# Patient Record
Sex: Male | Born: 1985 | Race: White | Hispanic: No | Marital: Single | Smoking: Current some day smoker
Health system: Southern US, Community
[De-identification: ages and names within clinical notes are randomized; demographics above are authoritative.]

## PROBLEM LIST (undated history)

## (undated) DIAGNOSIS — F909 Attention-deficit hyperactivity disorder, unspecified type: Secondary | ICD-10-CM

## (undated) DIAGNOSIS — A64 Unspecified sexually transmitted disease: Secondary | ICD-10-CM

---
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
## 2013-12-28 ENCOUNTER — Emergency Department (HOSPITAL_COMMUNITY)
Admission: EM | Admit: 2013-12-28 | Discharge: 2013-12-28 | Disposition: A | Discharge status: Home / Self Care | Payer: Self-pay | Attending: Emergency Medicine | Admitting: Emergency Medicine

## 2013-12-28 ENCOUNTER — Encounter (HOSPITAL_COMMUNITY): Payer: Self-pay | Admitting: Emergency Medicine

## 2013-12-28 DIAGNOSIS — R369 Urethral discharge, unspecified: Secondary | ICD-10-CM | POA: Insufficient documentation

## 2013-12-28 DIAGNOSIS — F17200 Nicotine dependence, unspecified, uncomplicated: Secondary | ICD-10-CM | POA: Insufficient documentation

## 2013-12-28 DIAGNOSIS — R300 Dysuria: Secondary | ICD-10-CM | POA: Insufficient documentation

## 2013-12-28 LAB — RPR: RPR Ser Ql: NONREACTIVE

## 2013-12-28 LAB — HIV ANTIBODY (ROUTINE TESTING W REFLEX): HIV: NONREACTIVE

## 2013-12-28 MED ORDER — CEFTRIAXONE SODIUM 250 MG IJ SOLR
250.0000 mg | Freq: Once | INTRAMUSCULAR | Status: AC
  Administered 2013-12-28: 250 mg via INTRAMUSCULAR
  Filled 2013-12-28: qty 250

## 2013-12-28 MED ORDER — AZITHROMYCIN 250 MG PO TABS
1000.0000 mg | ORAL_TABLET | Freq: Once | ORAL | Status: AC
  Administered 2013-12-28: 1000 mg via ORAL
  Filled 2013-12-28: qty 4

## 2013-12-28 MED ORDER — LIDOCAINE HCL 1 % IJ SOLN
INTRAMUSCULAR | Status: AC
  Administered 2013-12-28: 2.1 mL
  Filled 2013-12-28: qty 20

## 2013-12-28 NOTE — Progress Notes (Signed)
[facility] provided pt with a list of primary care resources to help patient establish primary care.  °

## 2013-12-28 NOTE — ED Notes (Signed)
Pt states that he has clear, watery discharge from penis with intermittent pain with urination. Pt afraid he has contracted chlamydia from his ex.

## 2013-12-28 NOTE — Discharge Instructions (Signed)
Refrain from sexual intercourse for 7 days. Be sure to have all partners tested and treated for STDs.  This may be done by your primary care provider or at the health department.  Practice safe sex by always wearing condoms.   Emergency Department Resource Guide 1) Find a Doctor and Pay Out of Pocket Although you won't have to find out who is covered by your insurance plan, it is a good idea to ask around and get recommendations. You will then need to call the office and see if the doctor you have chosen will accept you as a new patient and what types of options they offer for patients who are self-pay. Some doctors offer discounts or will set up payment plans for their patients who do not have insurance, but you will need to ask so you aren't surprised when you get to your appointment.  2) Contact Your Local Health Department Not all health departments have doctors that can see patients for sick visits, but many do, so it is worth a call to see if yours does. If you don't know where your local health department is, you can check in your phone book. The CDC also has a tool to help you locate your state's health department, and many state websites also have listings of all of their local health departments.  3) Find a Walk-in Clinic If your illness is not likely to be very severe or complicated, you may want to try a walk in clinic. These are popping up all over the country in pharmacies, drugstores, and shopping centers. They're usually staffed by nurse practitioners or physician assistants that have been trained to treat common illnesses and complaints. They're usually fairly quick and inexpensive. However, if you have serious medical issues or chronic medical problems, these are probably not your best option.  No Primary Care Doctor: - Call Health Connect at  [phone] - they can help you locate a primary care doctor that  accepts your insurance, provides certain services, etc. - Physician Referral  Service- [phone]  Chronic Pain Problems: Organization         Address  Phone   Notes  [facility]  [phone] Patients need to be referred by their primary care doctor.   Medication Assistance: Organization         Address  Phone   Notes  [facility] Med[facility] [address] [place], [place] [phone] --Must be a resident of [facility] [place] -- Must have NO insurance coverage whatsoever (no Medicaid/ Medicare, etc.) -- The pt. MUST have a primary care doctor that directs their care regularly and follows them in the community   Med[facility]ist  [phone]   [employer]  [phone]    Agencies that provide inexpensive medical care: Buyer, retailrganization         Address                                                       Phone  Notes  [facility]  [phone]   [facility]    [phone]   [facility] [address] [place], [place] [address] [phone]   [facility] [address], [place] [phone]   Planned Parenthood    [phone]   [facility]    [phone]   [facility]  [address], [place] Phone:  [phone], Fax:  [phone] Hours of Operation:  9 am - 6 pm, M-F.  Also accepts Medicaid/Medicare and self-pay.  [facility]  [address], [place] Phone: [phone], Fax: [phone]. Hours of Operation:  8:30 am - 5:30 pm, M-F.  Also accepts Medicaid and self-pay.  [facility] [place] [address] [place] Phone: [phone]   [facility] [address] Natasha Bence [place], [place] [phone], Ext. [phone] Mondays & Thursdays: 7-9 AM.  First 15 patients are seen on a first come, first serve basis.    Medicaid-accepting  [facility] Providers:  Organization         Address                                                                       Phone                               Notes  [facility] [address], [place] [phone] Also accepts self-pay patients.  [facility] [address] Laurell Josephs [place], [place]  [phone]   [facility] [address], [place] [phone]   [facility] [address] [place] [phone]   Renaye Rakers [address], [place]   [phone] Only accepts [place] patients after they have their name applied to their card.   Self-Pay (no insurance) in [facility]:   Organization         Address                                                     Phone               Notes  Sickle Cell Patients, [facility] [address] [place], [place] [phone]   [facility] [address] [place], [place] [phone]   [facility] [place]  [address], [place] [phone]   [facility]/Dr. Osei-Bonsu  [address] [place] or [address], [place] [phone] Phone number for both [place] and [place] locations is the same.  [facility] [address], [place] [phone]   [facility] [address], [place] or [address] [phone]   [facility] [address], [place] [phone], phone; [phone], fax Sees patients 1st and 3rd Saturday of every month.  Must not qualify for public or private insurance (i.e. Medicaid, Medicare, [place] Health Choice, Veterans' Benefits)  Household income should be no more than 200% of the poverty level The clinic cannot treat you if you are pregnant or think you are pregnant   Sexually transmitted diseases are not treated at the clinic.    Dental Care: Organization         Address                                  Phone                       Notes  [facility] Department of [facility] [address] [place], [place] [phone] Accepts children up to age 69 who are enrolled in [place] or [place] Health Choice; pregnant women with a Medicaid card; and children who have applied for Medicaid or [place] Health Choice, but were declined, whose parents can pay a reduced fee at time of service.  [facility] Department of [facility]  [address], [place] [phone] Accepts children up to age 28 who are enrolled in [place] or [place] Health Choice; pregnant women with a Medicaid card; and children who have applied for Medicaid or [place] Health Choice, but were declined, whose parents can pay a reduced fee at time of service.  [facility]  [address] [place], [place] [phone] Patients are seen by appointment only. Walk-ins are not accepted. [facility] will see patients 69 years of age and older. Monday - Tuesday (8am-5pm) Most Wednesdays (8:30-5pm) $30 per visit, cash only  [facility] Adult Dental Access PROGRAM  [address], [facility] [phone] Patients are seen by appointment only. Walk-ins are not accepted. [facility] will see patients 64 years of age and older. One Wednesday Evening (Monthly: Volunteer Based).  $30 per visit, cash only  [employer]  [phone] for adults; Children under age 74, call Graduate Pediatric Dentistry at [phone]. Children aged 21-14, please call [phone] to request a pediatric application.  Dental services are provided in all areas of dental care including fillings, crowns and bridges, complete and partial dentures, implants, gum treatment, root canals, and extractions. Preventive care is also provided. Treatment  is provided to both adults and children. Patients are selected via a lottery and there is often a waiting list.   [facility] [address] [place]  [phone] [email or web address]   [facility] [address] [place], [place] [phone], Ext. [phone] Second and Fourth Thursday of each month, opens at 6:30 AM; Clinic ends at 9 AM.  Patients are seen on a first-come first-served basis, and a limited number are seen during each clinic.   [facility]  [address] Ether Griffins [place], [place] [phone]   Eligibility Requirements You must have lived in [place], [place], or [place] counties for at least the last three months.   You cannot be eligible for state or federal sponsored [place], including [employer], [place], or [employer].   You generally cannot be eligible for healthcare insurance through your employer.    How to apply: Eligibility screenings are held every  Tuesday and Wednesday afternoon from 1:00 pm until 4:00 pm. You do not need an appointment for the interview!  [facility] [address] [place], [place] [phone]   [facility] Health Department  [phone]   [facility] [place] Health Department  [phone]   [facility] Health Department  [phone]    Behavioral Health Resources in the Community: Intensive Outpatient Programs Organization         Address                                              Phone              Notes  [facility] [address], [place], [place] [phone]   [facility] [address] [place], [place] [phone]   [facility] [address] [place], [place]  [phone]   [facility] [address],  [place], [place] [phone] or [phone]   Substance Abuse Resources Organization         Address                                Phone  Notes  Alcohol and  Drug Services  [phone]   [facility]  [phone]   [facility] [place]  [phone]   Floydene Flock  [phone]   Residential & Outpatient Substance Abuse Program  [phone]   Psychological Services Organization         Address                                  Phone                Notes  [facility] Behavioral Health  [phone]   [facility]  [phone]   [facility] Mental Health [address], [place] [phone] or [phone]    Mobile Crisis Teams Organization         Address  Phone  Notes  [facility], Mobile Crisis Care Unit  [phone]   [facility]  [address] [place], [place] [phone]   Doristine Locks [address] [place] [phone]    Self-Help/Support Groups Organization         Address                         Phone             Notes  [facility] [place] - variety of support groups  336- I7437963 Call for more information  Narcotics Anonymous (NA), [facility] [address], [employer] [place]  2 meetings at this location   Statistician         Address                                                    Phone              Notes  [facility] [phone]  [address]    [place]  [phone]   [facility]  [address] [place] [address], [place], [place] [phone]   [facility] [address] [place], [place] [phone] Admissions: 8am-3pm M-F  [facility] [address]    [place], [place] [phone]   [facility] [address] [place], [place], [place] [phone]   [facility] [address],  [place], [place] [phone]   [facility] - Intensive Outpatient [address] Laurell Josephs [address], [place], [place] [phone]   [facility] ([facility] [address] [place]  [place], [place] [phone]  or [phone]   [facility] ([facility]) [address], [place], [place] [phone] Accepts Medicaid  [place] [address],  [place] [phone] Substance Abuse/Addiction Treatment   [facility] Organization         Address                                                            Phone                    Notes  [facility]  [phone]   Angie Fava, PhD [address] Ervin Knack [place], [place]   [phone] or [phone]   [facility] Behavioral   [address] [place], [place] [phone]   [facility] [address] [place], [place] [phone] Insurance/Medicaid/sponsorship through [facility] and Families [address]                                    [place], [place] [phone] Therapy/tele-psych/case  [facility] [address] [place], [place] [phone]    Dr. Lolly Mustache  [phone]   [facility]  [employer] [facility] Dept. 1) [address] [place] 2) [address], [place] 3)  [address], [place] [phone]  [phone]   [facility] Child Abuse Hotline 7018323776 or 9285716096 (After Hours)

## 2013-12-28 NOTE — ED Provider Notes (Signed)
CSN: [id number]     Arrival date & time 12/28/13  1238 History  This chart was scribed for non-physician practitioner, Junius Finner, PA-C working with Rolland Porter, MD by Greggory Stallion, ED scribe. This patient was seen in room WTR9/WTR9 and the patient's care was started at 3:04 PM.   Chief Complaint  Patient presents with  . Penile Discharge   The history is provided by the patient. No language interpreter was used.   HPI Comments: Fred Webster is a 28 y.o. male who presents to the Emergency Department complaining of clear, watery penile discharge and intermittent dysuria that started about one week ago. He thinks he might have gotten chlamydia from a past partner but is unsure. Denies fever, nausea, emesis, abdominal pain, rash.  Has not taken anything for symptoms. Does report having unprotected sex.  Does not know for sure.  History reviewed. No pertinent past medical history. History reviewed. No pertinent past surgical history. No family history on file. History  Substance Use Topics  . Smoking status: Current Every Day Smoker    Types: Cigarettes  . Smokeless tobacco: Not on file  . Alcohol Use: Yes     Comment: occasion    Review of Systems  Constitutional: Negative for fever.  Gastrointestinal: Negative for nausea, vomiting and abdominal pain.  Genitourinary: Positive for dysuria and discharge.  Skin: Negative for rash.  All other systems reviewed and are negative.   Allergies  Review of patient's allergies indicates no known allergies.  Home Medications  No current outpatient prescriptions on file.  BP 144/80  Pulse 113  Temp(Src) 97.6 F (36.4 C)  Resp 18  SpO2 100%  Physical Exam  Nursing note and vitals reviewed. Constitutional: He is oriented to person, place, and time. He appears well-developed and well-nourished.  HENT:  Head: Normocephalic and atraumatic.  Eyes: EOM are normal.  Neck: Normal range of motion.  Cardiovascular: Normal rate.    Pulmonary/Chest: Effort normal.  Genitourinary: Testes normal and penis normal. Right testis shows no mass, no swelling and no tenderness. Right testis is descended. Left testis shows no mass and no tenderness. Left testis is descended. No penile erythema or penile tenderness. No discharge found.  Examination chaperoned by Greggory Stallion.  Musculoskeletal: Normal range of motion.  Neurological: He is alert and oriented to person, place, and time.  Skin: Skin is warm and dry.  Psychiatric: He has a normal mood and affect. His behavior is normal.    ED Course  Procedures (including critical care time)  DIAGNOSTIC STUDIES: Oxygen Saturation is 100% on RA, normal by my interpretation.    COORDINATION OF CARE: 3:05 PM-Discussed treatment plan which includes STD testing and treatment with azithromycin and Rocephin with pt at bedside and pt agreed to plan.   Labs Review Labs Reviewed  GC/CHLAMYDIA PROBE AMP - Abnormal; Notable for the following:    CT Probe RNA POSITIVE (*)    All other components within normal limits  RPR  HIV ANTIBODY (ROUTINE TESTING)   Imaging Review No results found.   EKG Interpretation None      MDM   Final diagnoses:  Penile discharge    pt is a 28yo male presenting with penile discharge and dysuria. No other symptoms. Denies fever, n/v/d. Denies abdominal pain.  No rash or discharge on exam.  STD lab panel ordered and results pending. Will tx empirically with azithromycin and rocephin in ED. Advised to f/u with PCP and/or health department for further testing and treatment  of STDs. Return precautions provided. Pt verbalized understanding and agreement with tx plan.   I personally performed the services described in this documentation, which was scribed in my presence. The recorded information has been reviewed and is accurate.   Junius Finnerrin O'Malley, PA-C 12/29/13 1523

## 2013-12-29 LAB — GC/CHLAMYDIA PROBE AMP
CT Probe RNA: POSITIVE — AB
GC Probe RNA: NEGATIVE

## 2013-12-30 NOTE — ED Notes (Signed)
+   Chlamydia Patient treated with Rocephin And Zithromax-[facility] faxed 

## 2014-01-02 NOTE — ED Provider Notes (Signed)
Medical screening examination/treatment/procedure(s) were performed by non-physician practitioner and as supervising physician I was immediately available for consultation/collaboration.   EKG Interpretation None        Zamya Culhane Tray, MD 01/02/14 1050 

## 2014-03-04 ENCOUNTER — Emergency Department (HOSPITAL_BASED_OUTPATIENT_CLINIC_OR_DEPARTMENT_OTHER)
Admission: EM | Admit: 2014-03-04 | Discharge: 2014-03-04 | Disposition: A | Discharge status: Home / Self Care | Payer: Worker's Compensation | Attending: Emergency Medicine | Admitting: Emergency Medicine

## 2014-03-04 ENCOUNTER — Emergency Department (HOSPITAL_BASED_OUTPATIENT_CLINIC_OR_DEPARTMENT_OTHER): Payer: Worker's Compensation

## 2014-03-04 ENCOUNTER — Encounter (HOSPITAL_BASED_OUTPATIENT_CLINIC_OR_DEPARTMENT_OTHER): Payer: Self-pay | Admitting: Emergency Medicine

## 2014-03-04 DIAGNOSIS — R296 Repeated falls: Secondary | ICD-10-CM | POA: Insufficient documentation

## 2014-03-04 DIAGNOSIS — Z8659 Personal history of other mental and behavioral disorders: Secondary | ICD-10-CM | POA: Insufficient documentation

## 2014-03-04 DIAGNOSIS — S42409A Unspecified fracture of lower end of unspecified humerus, initial encounter for closed fracture: Secondary | ICD-10-CM | POA: Insufficient documentation

## 2014-03-04 DIAGNOSIS — F17200 Nicotine dependence, unspecified, uncomplicated: Secondary | ICD-10-CM | POA: Insufficient documentation

## 2014-03-04 DIAGNOSIS — S52123A Displaced fracture of head of unspecified radius, initial encounter for closed fracture: Secondary | ICD-10-CM | POA: Insufficient documentation

## 2014-03-04 DIAGNOSIS — Y9289 Other specified places as the place of occurrence of the external cause: Secondary | ICD-10-CM | POA: Insufficient documentation

## 2014-03-04 DIAGNOSIS — Y939 Activity, unspecified: Secondary | ICD-10-CM | POA: Insufficient documentation

## 2014-03-04 DIAGNOSIS — S42402A Unspecified fracture of lower end of left humerus, initial encounter for closed fracture: Secondary | ICD-10-CM

## 2014-03-04 HISTORY — DX: Attention-deficit hyperactivity disorder, unspecified type: F90.9

## 2014-03-04 MED ORDER — TRAMADOL HCL 50 MG PO TABS: 50.0000 mg | ORAL_TABLET | Freq: Four times a day (QID) | ORAL | Status: AC | PRN

## 2014-03-04 MED ORDER — KETOROLAC TROMETHAMINE 60 MG/2ML IM SOLN
60.0000 mg | Freq: Once | INTRAMUSCULAR | Status: AC
  Administered 2014-03-04: 60 mg via INTRAMUSCULAR

## 2014-03-04 MED ORDER — KETOROLAC TROMETHAMINE 60 MG/2ML IM SOLN
INTRAMUSCULAR | Status: AC
  Filled 2014-03-04: qty 2

## 2014-03-04 NOTE — ED Notes (Signed)
Pt fell forward from standing position onto left hand.  Pain in left elbow and just distal to elbow on left lower arm. Sts a strap got around his ankle causing him to fall.

## 2014-03-04 NOTE — ED Provider Notes (Signed)
CSN: [id number]     Arrival date & time 03/04/14  0108 History   First MD Initiated Contact with Patient 03/04/14 0128     Chief Complaint  Patient presents with  . Arm Injury     (Consider location/radiation/quality/duration/timing/severity/associated sxs/prior Treatment) Patient is a 28 y.o. male presenting with arm injury. The history is provided by the patient.  Arm Injury Location:  Elbow and arm Injury: yes   Mechanism of injury: fall   Fall:    Fall occurred:  Standing   Impact surface:  Primary school teacher of impact:  Outstretched arms   Entrapped after fall: no   Arm location:  L arm Elbow location:  L elbow Pain details:    Quality:  Aching   Radiates to:  Does not radiate   Severity:  Severe   Onset quality:  Sudden   Timing:  Constant   Progression:  Unchanged Chronicity:  New Dislocation: no   Foreign body present:  No foreign bodies Ineffective treatments:  None tried Associated symptoms: no neck pain, no numbness and no stiffness     Past Medical History  Diagnosis Date  . ADHD (attention deficit hyperactivity disorder)    History reviewed. No pertinent past surgical history. No family history on file. History  Substance Use Topics  . Smoking status: Current Every Day Smoker -- 0.50 packs/day    Types: Cigarettes  . Smokeless tobacco: Not on file  . Alcohol Use: Yes     Comment: occasion    Review of Systems  Musculoskeletal: Negative for neck pain and stiffness.  Neurological: Negative for weakness and numbness.  All other systems reviewed and are negative.     Allergies  Review of patient's allergies indicates no known allergies.  Home Medications   Prior to Admission medications   Not on File   BP 137/81  Pulse 86  Temp(Src) 98.3 F (36.8 C) (Oral)  Resp 20  Ht 5\' 10"  (1.778 m)  Wt 125 lb (56.7 kg)  BMI 17.94 kg/m2  SpO2 100% Physical Exam  Constitutional: He is oriented to person, place, and time. He appears well-developed  and well-nourished. No distress.  HENT:  Head: Normocephalic and atraumatic.  Mouth/Throat: Oropharynx is clear and moist.  Eyes: Conjunctivae are normal. Pupils are equal, round, and reactive to light.  Neck: Normal range of motion. Neck supple.  Cardiovascular: Normal rate, regular rhythm and intact distal pulses.   Pulmonary/Chest: Effort normal and breath sounds normal. He has no wheezes. He has no rales.  Abdominal: Soft. Bowel sounds are normal. There is no tenderness. There is no rebound and no guarding.  Musculoskeletal: Normal range of motion. He exhibits no edema.  5/5 LUE strength, NVI LUE cap refill < 2 sec to all digits  Neurological: He is alert and oriented to person, place, and time.  Skin: Skin is warm and dry.  Psychiatric: He has a normal mood and affect.    ED Course  Procedures (including critical care time) Labs Review Labs Reviewed - No data to display  Imaging Review No results found.   EKG Interpretation None      MDM   Final diagnoses:  None    Splint applied.  Follow up with hand surgery this week.  Call your employer about workman comp paperwork.  Pain medication is a narcotic which will affect your UDS.  Please let your supervisor know this.  Patient verbalized understanding and agrees to follow up    Saba Gomm K Birtie Fellman-Rasch,  MD 03/04/14 [id number]

## 2014-03-04 NOTE — Discharge Instructions (Signed)
Cast or Splint Care  Casts and splints support injured limbs and keep bones from moving while they heal. It is important to care for your cast or splint at home.   HOME CARE INSTRUCTIONS   Keep the cast or splint uncovered during the drying period. It can take 24 to 48 hours to dry if it is made of plaster. A fiberglass cast will dry in less than 1 hour.   Do not rest the cast on anything harder than a pillow for the first 24 hours.   Do not put weight on your injured limb or apply pressure to the cast until your health care provider gives you permission.   Keep the cast or splint dry. Wet casts or splints can lose their shape and may not support the limb as well. A wet cast that has lost its shape can also create harmful pressure on your skin when it dries. Also, wet skin can become infected.   Cover the cast or splint with a plastic bag when bathing or when out in the rain or snow. If the cast is on the trunk of the body, take sponge baths until the cast is removed.   If your cast does become wet, dry it with a towel or a blow dryer on the cool setting only.   Keep your cast or splint clean. Soiled casts may be wiped with a moistened cloth.   Do not place any hard or soft foreign objects under your cast or splint, such as cotton, toilet paper, lotion, or powder.   Do not try to scratch the skin under the cast with any object. The object could get stuck inside the cast. Also, scratching could lead to an infection. If itching is a problem, use a blow dryer on a cool setting to relieve discomfort.   Do not trim or cut your cast or remove padding from inside of it.   Exercise all joints next to the injury that are not immobilized by the cast or splint. For example, if you have a long leg cast, exercise the hip joint and toes. If you have an arm cast or splint, exercise the shoulder, elbow, thumb, and fingers.   Elevate your injured arm or leg on 1 or 2 pillows for the first 1 to 3 days to decrease  swelling and pain.It is best if you can comfortably elevate your cast so it is higher than your heart.  SEEK MEDICAL CARE IF:    Your cast or splint cracks.   Your cast or splint is too tight or too loose.   You have unbearable itching inside the cast.   Your cast becomes wet or develops a soft spot or area.   You have a bad smell coming from inside your cast.   You get an object stuck under your cast.   Your skin around the cast becomes red or raw.   You have new pain or worsening pain after the cast has been applied.  SEEK IMMEDIATE MEDICAL CARE IF:    You have fluid leaking through the cast.   You are unable to move your fingers or toes.   You have discolored (blue or white), cool, painful, or very swollen fingers or toes beyond the cast.   You have tingling or numbness around the injured area.   You have severe pain or pressure under the cast.   You have any difficulty with your breathing or have shortness of breath.   You have chest   pain.  Document Released: 09/21/2000 Document Revised: 07/15/2013 Document Reviewed: 04/02/2013  Exit[employer] Patient Information 2014 [employer].

## 2014-03-04 NOTE — ED Notes (Signed)
MD at bedside. 

## 2014-04-15 ENCOUNTER — Encounter (HOSPITAL_BASED_OUTPATIENT_CLINIC_OR_DEPARTMENT_OTHER): Payer: Self-pay | Admitting: Emergency Medicine

## 2014-04-15 ENCOUNTER — Emergency Department (HOSPITAL_BASED_OUTPATIENT_CLINIC_OR_DEPARTMENT_OTHER)
Admission: EM | Admit: 2014-04-15 | Discharge: 2014-04-15 | Disposition: A | Discharge status: Home / Self Care | Payer: Self-pay | Attending: Emergency Medicine | Admitting: Emergency Medicine

## 2014-04-15 DIAGNOSIS — Z79899 Other long term (current) drug therapy: Secondary | ICD-10-CM | POA: Insufficient documentation

## 2014-04-15 DIAGNOSIS — Z8619 Personal history of other infectious and parasitic diseases: Secondary | ICD-10-CM | POA: Insufficient documentation

## 2014-04-15 DIAGNOSIS — Z8659 Personal history of other mental and behavioral disorders: Secondary | ICD-10-CM | POA: Insufficient documentation

## 2014-04-15 DIAGNOSIS — N342 Other urethritis: Secondary | ICD-10-CM | POA: Insufficient documentation

## 2014-04-15 DIAGNOSIS — F17200 Nicotine dependence, unspecified, uncomplicated: Secondary | ICD-10-CM | POA: Insufficient documentation

## 2014-04-15 HISTORY — DX: Unspecified sexually transmitted disease: A64

## 2014-04-15 MED ORDER — METRONIDAZOLE 500 MG PO TABS
2000.0000 mg | ORAL_TABLET | Freq: Once | ORAL | Status: AC
  Administered 2014-04-15: 2000 mg via ORAL
  Filled 2014-04-15: qty 4

## 2014-04-15 MED ORDER — AZITHROMYCIN 250 MG PO TABS
1000.0000 mg | ORAL_TABLET | Freq: Once | ORAL | Status: AC
  Administered 2014-04-15: 1000 mg via ORAL
  Filled 2014-04-15: qty 4

## 2014-04-15 MED ORDER — CEFTRIAXONE SODIUM 250 MG IJ SOLR
250.0000 mg | Freq: Once | INTRAMUSCULAR | Status: AC
  Administered 2014-04-15: 250 mg via INTRAMUSCULAR
  Filled 2014-04-15: qty 250

## 2014-04-15 NOTE — ED Provider Notes (Signed)
CSN: [id number]     Arrival date & time 04/15/14  1214 History   First MD Initiated Contact with Patient 04/15/14 1249     Chief Complaint  Patient presents with  . Penile Discharge     (Consider location/radiation/quality/duration/timing/severity/associated sxs/prior Treatment) HPI Comments: Patient presents to the ER for evaluation of dysuria and urethral discharge. Patient reports that he has had this before with an STD. He has not had any fever, nausea, vomiting. Denies penile lesions. No testicular pain or swelling.  Patient is a 28 y.o. male presenting with penile discharge.  Penile Discharge    Past Medical History  Diagnosis Date  . ADHD (attention deficit hyperactivity disorder)   . STD (male)    No past surgical history on file. No family history on file. History  Substance Use Topics  . Smoking status: Current Every Day Smoker -- 0.50 packs/day    Types: Cigarettes  . Smokeless tobacco: Not on file  . Alcohol Use: Yes     Comment: occasion    Review of Systems  Genitourinary: Positive for discharge.  All other systems reviewed and are negative.     Allergies  Review of patient's allergies indicates no known allergies.  Home Medications   Prior to Admission medications   Medication Sig Start Date End Date Taking? Authorizing Provider  traMADol (ULTRAM) 50 MG tablet Take 1 tablet (50 mg total) by mouth every 6 (six) hours as needed. 03/04/14   April K Palumbo-Rasch, MD   BP 121/78  Pulse 82  Temp(Src) 98.7 F (37.1 C) (Oral)  Resp 16  Ht 6' (1.829 m)  Wt 125 lb (56.7 kg)  BMI 16.95 kg/m2  SpO2 100% Physical Exam  Constitutional: He is oriented to person, place, and time. He appears well-developed and well-nourished. No distress.  HENT:  Head: Normocephalic and atraumatic.  Right Ear: Hearing normal.  Left Ear: Hearing normal.  Nose: Nose normal.  Mouth/Throat: Oropharynx is clear and moist and mucous membranes are normal.  Eyes: Conjunctivae  and EOM are normal. Pupils are equal, round, and reactive to light.  Neck: Normal range of motion. Neck supple.  Cardiovascular: Regular rhythm, S1 normal and S2 normal.  Exam reveals no gallop and no friction rub.   No murmur heard. Pulmonary/Chest: Effort normal and breath sounds normal. No respiratory distress. He exhibits no tenderness.  Abdominal: Soft. Normal appearance and bowel sounds are normal. There is no hepatosplenomegaly. There is no tenderness. There is no rebound, no guarding, no tenderness at McBurney's point and negative Murphy's sign. No hernia. Hernia confirmed negative in the right inguinal area and confirmed negative in the left inguinal area.  Genitourinary: Testes normal and penis normal.  Musculoskeletal: Normal range of motion.  Lymphadenopathy:       Right: No inguinal adenopathy present.       Left: No inguinal adenopathy present.  Neurological: He is alert and oriented to person, place, and time. He has normal strength. No cranial nerve deficit or sensory deficit. Coordination normal. GCS eye subscore is 4. GCS verbal subscore is 5. GCS motor subscore is 6.  Skin: Skin is warm, dry and intact. No rash noted. No cyanosis.  Psychiatric: He has a normal mood and affect. His speech is normal and behavior is normal. Thought content normal.    ED Course  Procedures (including critical care time) Labs Review Labs Reviewed - No data to display  Imaging Review No results found.   EKG Interpretation None  MDM   Final diagnoses:  None   urethritis, STD  Patient presents to the ER with complaints of possible STD. Patient has had similar symptoms previously with an STD. He reports that his baby mama gave him an STD when she cheated on him in the past. He thinks that she has given him an STD again. Patient will be treated empirically.    Gilda Crease, MD 04/15/14 1329

## 2014-04-15 NOTE — ED Notes (Signed)
Pt having penile discharge x 2 days.  Some discharge.  No known fever.

## 2014-04-15 NOTE — Discharge Instructions (Signed)
Urethritis, Adult Urethritis is an inflammation of the tube through which urine exits your bladder (urethra).  CAUSES Urethritis is often caused by an infection in your urethra. The infection can be viral, like herpes. The infection can also be bacterial, like gonorrhea. RISK FACTORS Risk factors of urethritis include:  Having sex without using a condom.  Having multiple sexual partners.  Having poor hygiene. SIGNS AND SYMPTOMS Symptoms of urethritis are less noticeable in women than in men. These symptoms include:  Burning feeling when you urinate (dysuria).  Discharge from your urethra.  Blood in your urine (hematuria).  Urinating more than usual. DIAGNOSIS  To confirm a diagnosis of urethritis, your health care provider will do the following:  Ask about your sexual history.  Perform a physical exam.  Have you provide a sample of your urine for lab testing.  Use a cotton swab to gently collect a sample from your urethra for lab testing. TREATMENT  It is important to treat urethritis. Depending on the cause, untreated urethritis may lead to serious genital infections and possibly infertility. Urethritis caused by a bacterial infection is treated with antibiotics. All sexual partners must be treated.  HOME CARE INSTRUCTIONS  Do not have sex until the test results are known and treatment is completed, even if your symptoms go away before you finish treatment.  Finish all medicines that you are prescribed. SEEK MEDICAL CARE IF:   Your symptoms are not improved in 3 days.  Your symptoms are getting worse.  You develop abdominal pain or pelvic pain (in women).  You develop joint pain. SEEK IMMEDIATE MEDICAL CARE IF:   You have a fever with a temperature of 101.59F (38.8C) or greater.  You have severe pain in the belly, back, or side.  You have repeated vomiting. Document Released: 03/20/2001 Document Revised: 07/15/2013 Document Reviewed: 05/25/2013 [facility] LPExitCare  Patient Information 2015 [place], [place]. This information is not intended to replace advice given to you by your health care provider. Make sure you discuss any questions you have with your health care provider.  Sexually Transmitted Disease A sexually transmitted disease (STD) is a disease or infection that may be passed (transmitted) from person to person, usually during sexual activity. This may happen by way of saliva, semen, blood, vaginal mucus, or urine. Common STDs include:   Gonorrhea.   Chlamydia.   Syphilis.   HIV and AIDS.   Genital herpes.   Hepatitis B and C.   Trichomonas.   Human papillomavirus (HPV).   Pubic lice.   Scabies.  Mites.  Bacterial vaginosis. WHAT ARE CAUSES OF STDs? An STD may be caused by bacteria, a virus, or parasites. STDs are often transmitted during sexual activity if one person is infected. However, they may also be transmitted through nonsexual means. STDs may be transmitted after:   Sexual intercourse with an infected person.   Sharing sex toys with an infected person.   Sharing needles with an infected person or using unclean piercing or tattoo needles.  Having intimate contact with the genitals, mouth, or rectal areas of an infected person.   Exposure to infected fluids during birth. WHAT ARE THE SIGNS AND SYMPTOMS OF STDs? Different STDs have different symptoms. Some people may not have any symptoms. If symptoms are present, they may include:   Painful or bloody urination.   Pain in the pelvis, abdomen, vagina, anus, throat, or eyes.   A skin rash, itching, or irritation.  Growths, ulcerations, blisters, or sores in the genital and anal  areas.  Abnormal vaginal discharge with or without bad odor.   Penile discharge in men.   Fever.   Pain or bleeding during sexual intercourse.   Swollen glands in the groin area.   Yellow skin and eyes (jaundice). This is seen with hepatitis.   Swollen  testicles.  Infertility.  Sores and blisters in the mouth. HOW ARE STDs DIAGNOSED? To make a diagnosis, your health care provider may:   Take a medical history.   Perform a physical exam.   Take a sample of any discharge to examine.  Swab the throat, cervix, opening to the penis, rectum, or vagina for testing.  Test a sample of your first morning urine.   Perform blood tests.   Perform a Pap test, if this applies.   Perform a colposcopy.   Perform a laparoscopy.  HOW ARE STDs TREATED? Treatment depends on the STD. Some STDs may be treated but not cured.   Chlamydia, gonorrhea, trichomonas, and syphilis can be cured with antibiotic medicine.   Genital herpes, hepatitis, and HIV can be treated, but not cured, with prescribed medicines. The medicines lessen symptoms.   Genital warts from HPV can be treated with medicine or by freezing, burning (electrocautery), or surgery. Warts may come back.   HPV cannot be cured with medicine or surgery. However, abnormal areas may be removed from the cervix, vagina, or vulva.   If your diagnosis is confirmed, your recent sexual partners need treatment. This is true even if they are symptom-free or have a negative culture or evaluation. They should not have sex until their health care providers say it is okay. HOW CAN I REDUCE MY RISK OF GETTING AN STD? Take these steps to reduce your risk of getting an STD:  Use latex condoms, dental dams, and water-soluble lubricants during sexual activity. Do not use petroleum jelly or oils.  Avoid having multiple sex partners.  Do not have sex with someone who has other sex partners.  Do not have sex with anyone you do not know or who is at high risk for an STD.  Avoid risky sex practices that can break your skin.  Do not have sex if you have open sores on your mouth or skin.  Avoid drinking too much alcohol or taking illegal drugs. Alcohol and drugs can affect your judgment and put  you in a vulnerable position.  Avoid engaging in oral and anal sex acts.  Get vaccinated for HPV and hepatitis. If you have not received these vaccines in the past, talk to your health care provider about whether one or both might be right for you.   If you are at risk of being infected with HIV, it is recommended that you take a prescription medicine daily to prevent HIV infection. This is called pre-exposure prophylaxis (PrEP). You are considered at risk if:  You are a man who has sex with other men (MSM).  You are a heterosexual man or woman and are sexually active with more than one partner.  You take drugs by injection.  You are sexually active with a partner who has HIV.  Talk with your health care provider about whether you are at high risk of being infected with HIV. If you choose to begin PrEP, you should first be tested for HIV. You should then be tested every 3 months for as long as you are taking PrEP.  WHAT SHOULD I DO IF I THINK I HAVE AN STD?  See your health care provider.  Tell your sexual partner(s). They should be tested and treated for any STDs.  Do not have sex until your health care provider says it is okay. WHEN SHOULD I GET IMMEDIATE MEDICAL CARE? Contact your health care provider right away if:   You have severe abdominal pain.  You are a man and notice swelling or pain in your testicles.  You are a woman and notice swelling or pain in your vagina. Document Released: 12/15/2002 Document Revised: 09/29/2013 Document Reviewed: 04/14/2013 [facility]ExitCare Patient Information 2015 [place]ExitCare, [place]. This information is not intended to replace advice given to you by your health care provider. Make sure you discuss any questions you have with your health care provider.

## 2014-12-04 IMAGING — CR DG ELBOW COMPLETE 3+V*L*
4 series · 4 of 4 positions shown · non-contrast
Comparison: None.

CLINICAL DATA: Status post fall; diffuse left elbow pain.

EXAM:
LEFT ELBOW - COMPLETE 3+ VIEW

[x elbow joint ap left]
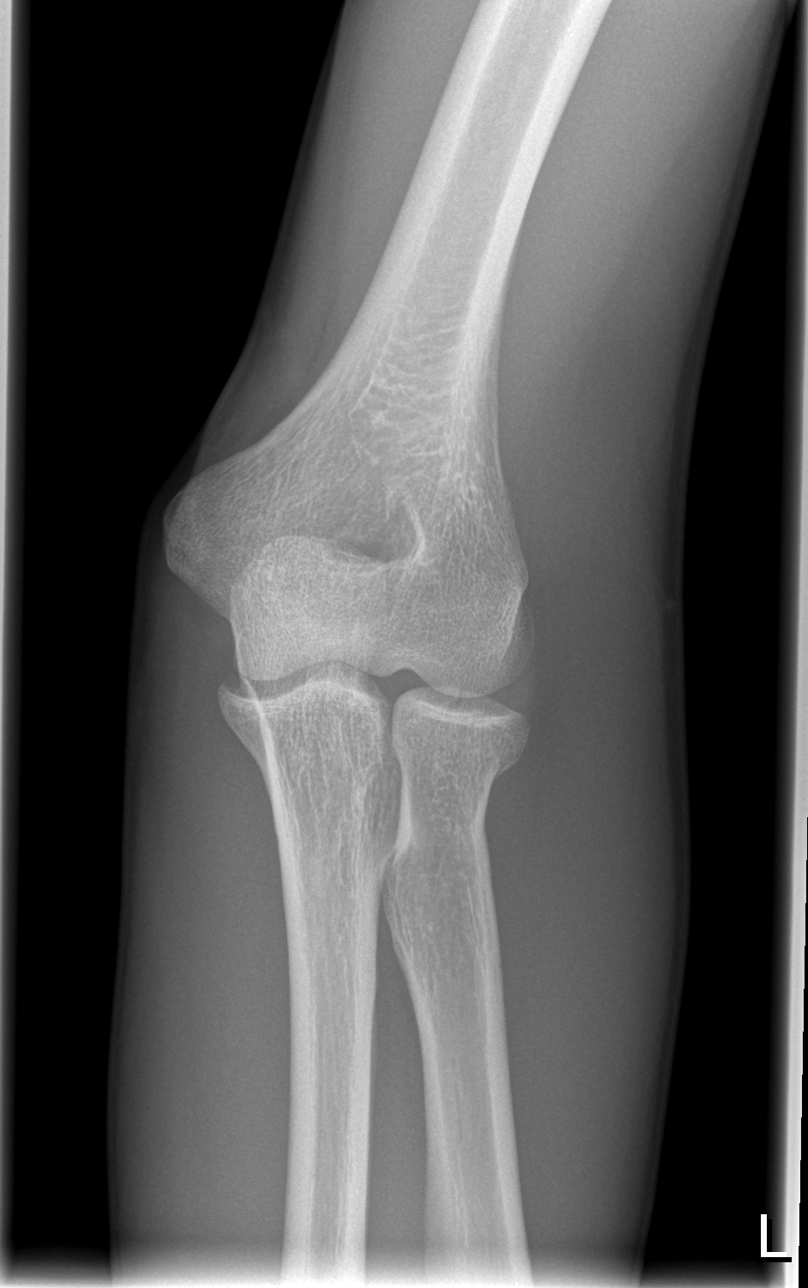

[x elbow joint obl. left (1 of 2)]
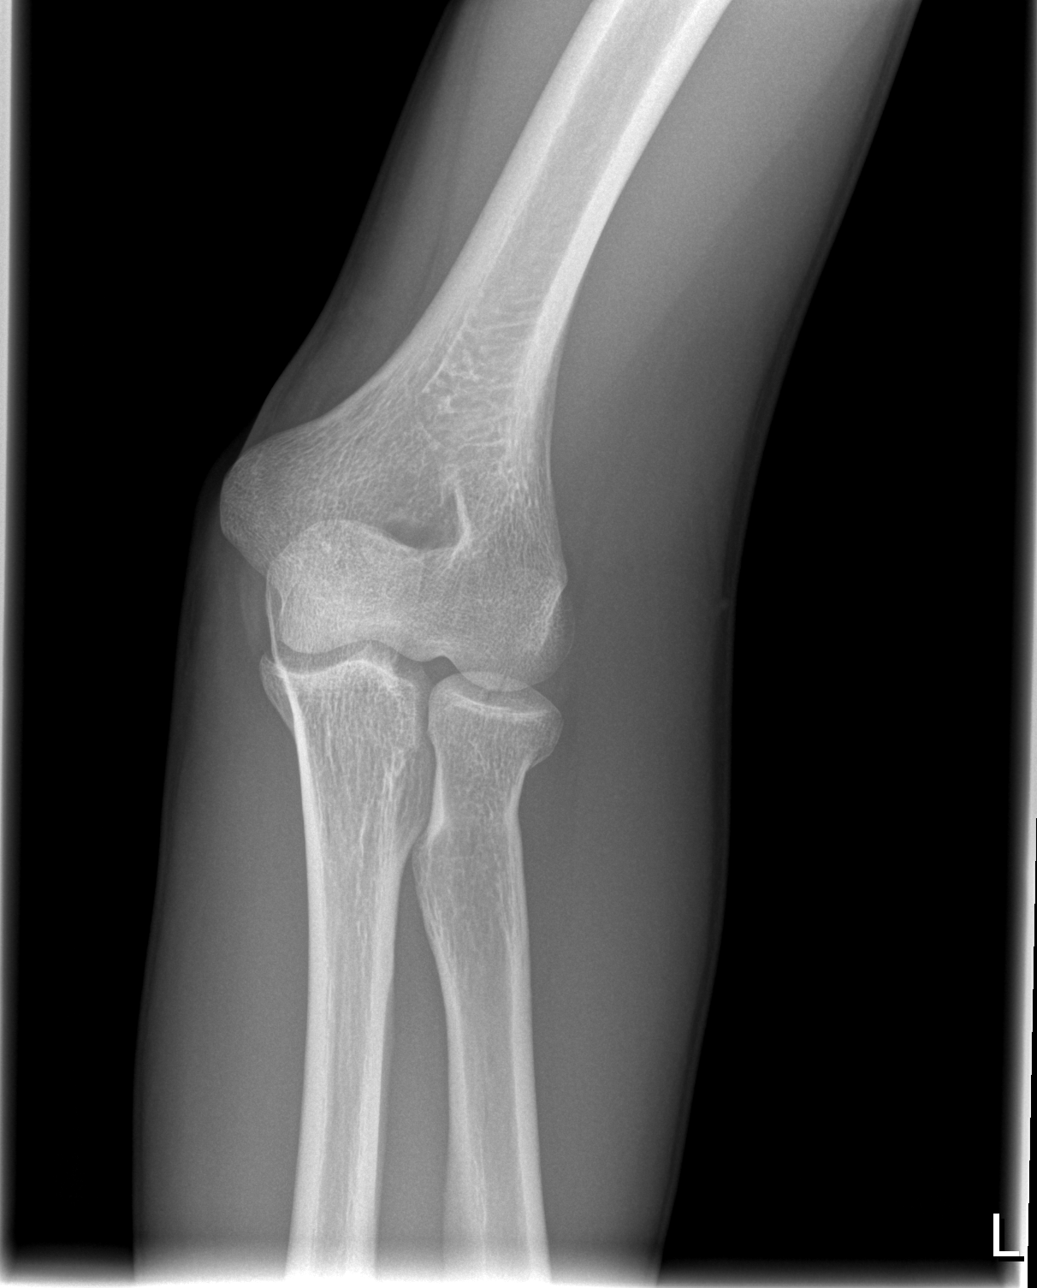

[x elbow joint obl. left (2 of 2)]
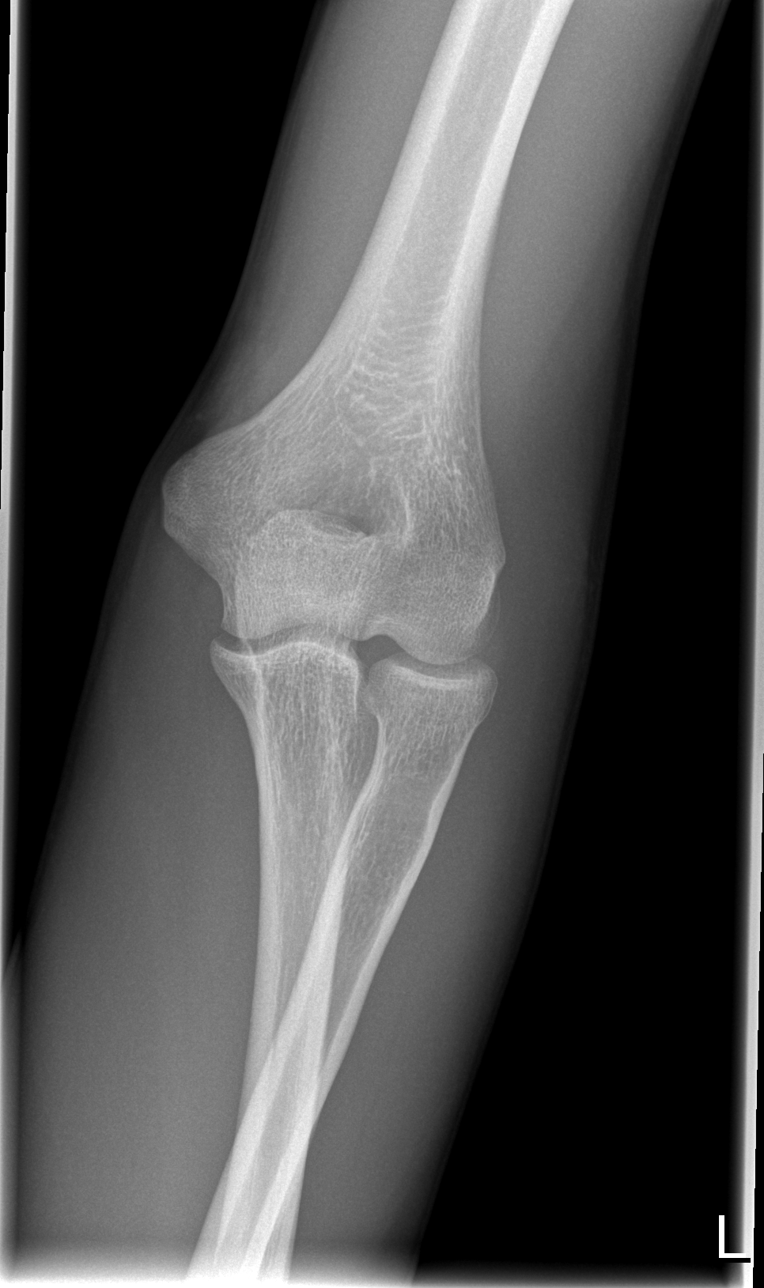

[x elbow joint lat left]
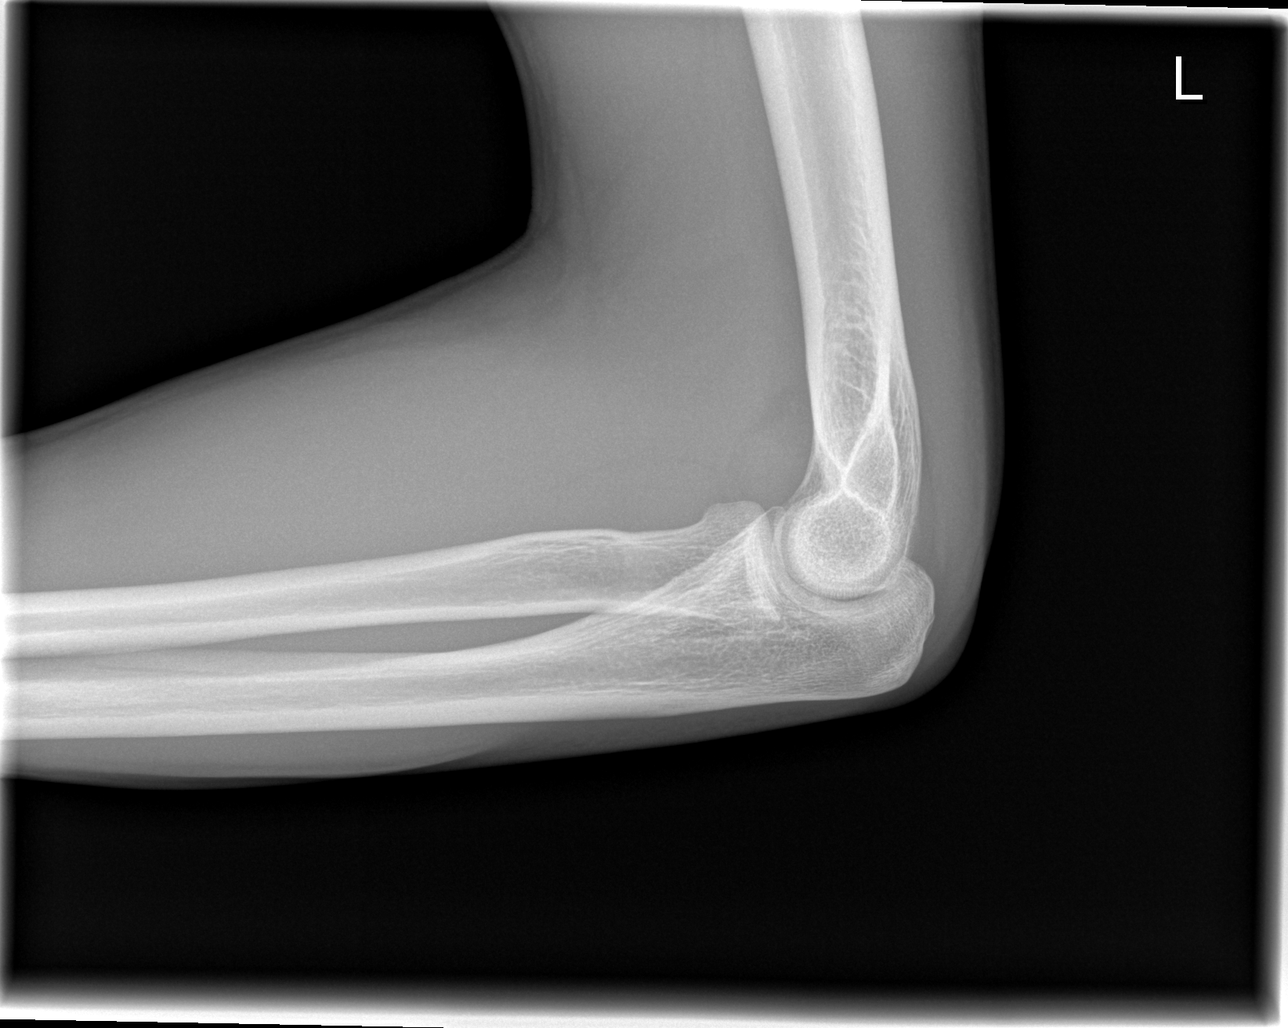

[4 of 4 positions shown; findings below may reference images not displayed]

FINDINGS: There is a minimally displaced fracture extending across the radial
aspect of the radial head, with intra-articular extension. An
associated elbow joint effusion is noted. No additional fractures
are seen.
IMPRESSION: Minimally displaced fracture extending across the radial aspect of
the radial head, with intra-articular extension. Associated elbow
joint effusion noted.

## 2020-03-18 ENCOUNTER — Other Ambulatory Visit: Payer: Self-pay

## 2020-03-18 ENCOUNTER — Encounter (HOSPITAL_BASED_OUTPATIENT_CLINIC_OR_DEPARTMENT_OTHER): Payer: Self-pay

## 2020-03-18 ENCOUNTER — Emergency Department (HOSPITAL_BASED_OUTPATIENT_CLINIC_OR_DEPARTMENT_OTHER)
Admission: EM | Admit: 2020-03-18 | Discharge: 2020-03-18 | Disposition: A | Discharge status: Home / Self Care | Payer: BC Managed Care – PPO | Attending: Emergency Medicine | Admitting: Emergency Medicine

## 2020-03-18 DIAGNOSIS — A64 Unspecified sexually transmitted disease: Secondary | ICD-10-CM | POA: Insufficient documentation

## 2020-03-18 DIAGNOSIS — F17210 Nicotine dependence, cigarettes, uncomplicated: Secondary | ICD-10-CM | POA: Insufficient documentation

## 2020-03-18 DIAGNOSIS — H1089 Other conjunctivitis: Secondary | ICD-10-CM | POA: Diagnosis present

## 2020-03-18 DIAGNOSIS — H1031 Unspecified acute conjunctivitis, right eye: Secondary | ICD-10-CM

## 2020-03-18 MED ORDER — POLYMYXIN B-TRIMETHOPRIM 10000-0.1 UNIT/ML-% OP SOLN: 2.0000 [drp] | Freq: Four times a day (QID) | OPHTHALMIC | 0 refills | Status: AC

## 2020-03-18 MED ORDER — FLUORESCEIN SODIUM 1 MG OP STRP
1.0000 | ORAL_STRIP | Freq: Once | OPHTHALMIC | Status: AC
  Administered 2020-03-18: 1 via OPHTHALMIC
  Filled 2020-03-18: qty 1

## 2020-03-18 MED ORDER — TETRACAINE HCL 0.5 % OP SOLN
2.0000 [drp] | Freq: Once | OPHTHALMIC | Status: AC
  Administered 2020-03-18: 2 [drp] via OPHTHALMIC
  Filled 2020-03-18: qty 4

## 2020-03-18 NOTE — ED Provider Notes (Addendum)
TIME SEEN: 1:09 AM  CHIEF COMPLAINT: "I think I have pinkeye"  HPI: Patient is a 34 year old male who presents to the emergency department with concerns for bacterial conjunctivitis.  Reports he works for [employer] and there has been an outbreak of pinkeye.  States over the past day he has had redness, tearing and discomfort to the right eye.  States he flushed it at work tonight without any relief.  No chemical exposures.  No injury or concern for foreign body.  Reports he thinks that he needs to wear glasses but does not.  Does not wear contacts.  Does not have an ophthalmologist or optometrist.  Reports increased blurry vision in the right eye.  No vision loss.  Denies history of seasonal allergies.  Reports left eye is normal.  ROS: See HPI Constitutional: no fever  Eyes: no drainage  ENT: no runny nose   Cardiovascular:  no chest pain  Resp: no SOB  GI: no vomiting GU: no dysuria Integumentary: no rash  Allergy: no hives  Musculoskeletal: no leg swelling  Neurological: no slurred speech ROS otherwise negative  PAST MEDICAL HISTORY/PAST SURGICAL HISTORY:  Past Medical History:  Diagnosis Date  . ADHD (attention deficit hyperactivity disorder)   . STD (male)     MEDICATIONS:  Prior to Admission medications   Medication Sig Start Date End Date Taking? Authorizing Provider  traMADol (ULTRAM) 50 MG tablet Take 1 tablet (50 mg total) by mouth every 6 (six) hours as needed. 03/04/14   Palumbo, April, MD    ALLERGIES:  No Known Allergies  SOCIAL HISTORY:  Social History   Tobacco Use  . Smoking status: Current Some Day Smoker    Packs/day: 0.50    Types: Cigarettes  . Smokeless tobacco: Never Used  Substance Use Topics  . Alcohol use: Yes    Comment: occasion    FAMILY HISTORY: No family history on file.  EXAM: BP 134/85 (BP Location: Right Arm)   Pulse 75   Temp 98.6 F (37 C) (Oral)   Resp 14   Ht 6' (1.829 m)   Wt 61.2 kg   SpO2 100%   BMI 18.31 kg/m   CONSTITUTIONAL: Alert and oriented and responds appropriately to questions. Well-appearing; well-nourished HEAD: Normocephalic EYES: Conjunctiva clear on the, pupils appear equal, EOM appear intact, no pain with consensual light response, difficult to assess optic disc at this time due to patient's poor tolerance, conjunctiva on the right is injected with tearing but no purulent drainage, no hyphema or hypopyon, no chemosis, intraocular pressure of the right eye is 21 mmHg, no foreign body appreciated, no fluorescein uptake appreciated  Bilateral Distance: 20/63 R Distance: 20/200 L Distance: 20/100  ENT: normal nose; moist mucous membranes NECK: normal ROM CARD: Regular rate and rhythm RESP: Normal chest excursion without splinting or tachypnea; no appreciable wheezing.  No hypoxia or respiratory distress.  Speaking full sentences. ABD/GI: Appears normal and nondistended. BACK: Normal range of motion EXT: Normal ROM in all joints; no deformity noted SKIN: Normal color for age and race; warm; no rash on exposed skin NEURO: Moves all extremities equally PSYCH: The patient's mood and manner are appropriate.   MEDICAL DECISION MAKING: Patient here with what appears to be bacterial conjunctivitis versus viral conjunctivitis.  No foreign body, corneal abrasion or ulceration, glaucoma today.  Will discharge with Polytrim drops.  He has worsening visual acuity on the right than the left but both eyes seem to have some blurry vision at baseline and have  recommended close follow-up with an ophthalmologist.  He agrees stating that he thinks that he has needed glasses for a long time.  Will discharge home.  Discussed supportive care instructions.  Provided with work note.  At this time, I do not feel there is any life-threatening condition present. I have reviewed, interpreted and discussed all results (EKG, imaging, lab, urine as appropriate) and exam findings with patient/family. I have reviewed  nursing notes and appropriate previous records.  I feel the patient is safe to be discharged home without further emergent workup and can continue workup as an outpatient as needed. Discussed usual and customary return precautions. Patient/family verbalize understanding and are comfortable with this plan.  Outpatient follow-up has been provided as needed. All questions have been answered.    Fred Webster was evaluated in Emergency Department on 03/18/2020 for the symptoms described in the history of present illness. He was evaluated in the context of the global COVID-19 pandemic, which necessitated consideration that the patient might be at risk for infection with the SARS-CoV-2 virus that causes COVID-19. Institutional protocols and algorithms that pertain to the evaluation of patients at risk for COVID-19 are in a state of rapid change based on information released by regulatory bodies including the CDC and federal and state organizations. These policies and algorithms were followed during the patient's care in the ED.        Wilmer Berryhill, Delice Bison, DO 03/18/20 [phone]

## 2020-03-18 NOTE — ED Notes (Signed)
Pt does not wear glasses or contacts 

## 2020-03-18 NOTE — ED Triage Notes (Signed)
Pt c/o R eye irritation and drainage.
